# Patient Record
Sex: Male | Born: 1995 | Race: White | Hispanic: No | Marital: Single | Smoking: Never smoker
Health system: Southern US, Community
[De-identification: ages and names within clinical notes are randomized; demographics above are authoritative.]

## PROBLEM LIST (undated history)

## (undated) DIAGNOSIS — Z8781 Personal history of (healed) traumatic fracture: Secondary | ICD-10-CM

## (undated) HISTORY — DX: Personal history of (healed) traumatic fracture: Z87.81

---
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
## 2014-06-10 ENCOUNTER — Ambulatory Visit: Payer: BC Managed Care – PPO | Admitting: *Deleted

## 2014-06-10 DIAGNOSIS — Z23 Encounter for immunization: Secondary | ICD-10-CM

## 2014-06-16 ENCOUNTER — Ambulatory Visit (INDEPENDENT_AMBULATORY_CARE_PROVIDER_SITE_OTHER): Payer: BC Managed Care – PPO | Admitting: *Deleted

## 2014-06-16 DIAGNOSIS — Z23 Encounter for immunization: Secondary | ICD-10-CM

## 2014-11-12 IMAGING — CR DG HAND COMPLETE 3+V*L*
3 series · 3 of 3 positions shown · non-contrast
Comparison: None.

CLINICAL DATA: Follow up fracture

LEFT HAND - COMPLETE 3+ VIEW

[view not recorded (1 of 3)]
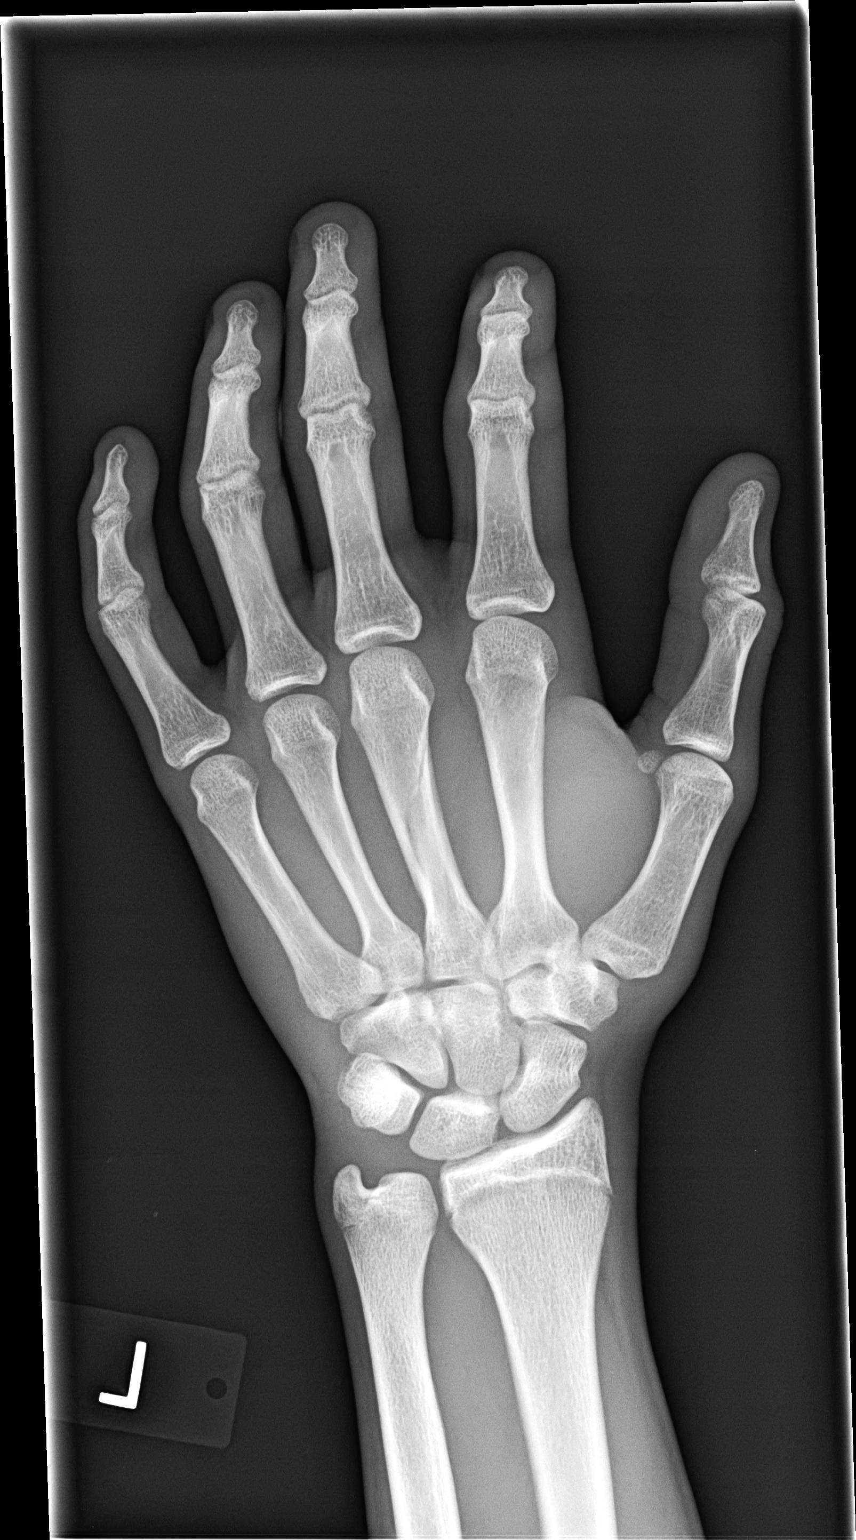

[view not recorded (2 of 3)]
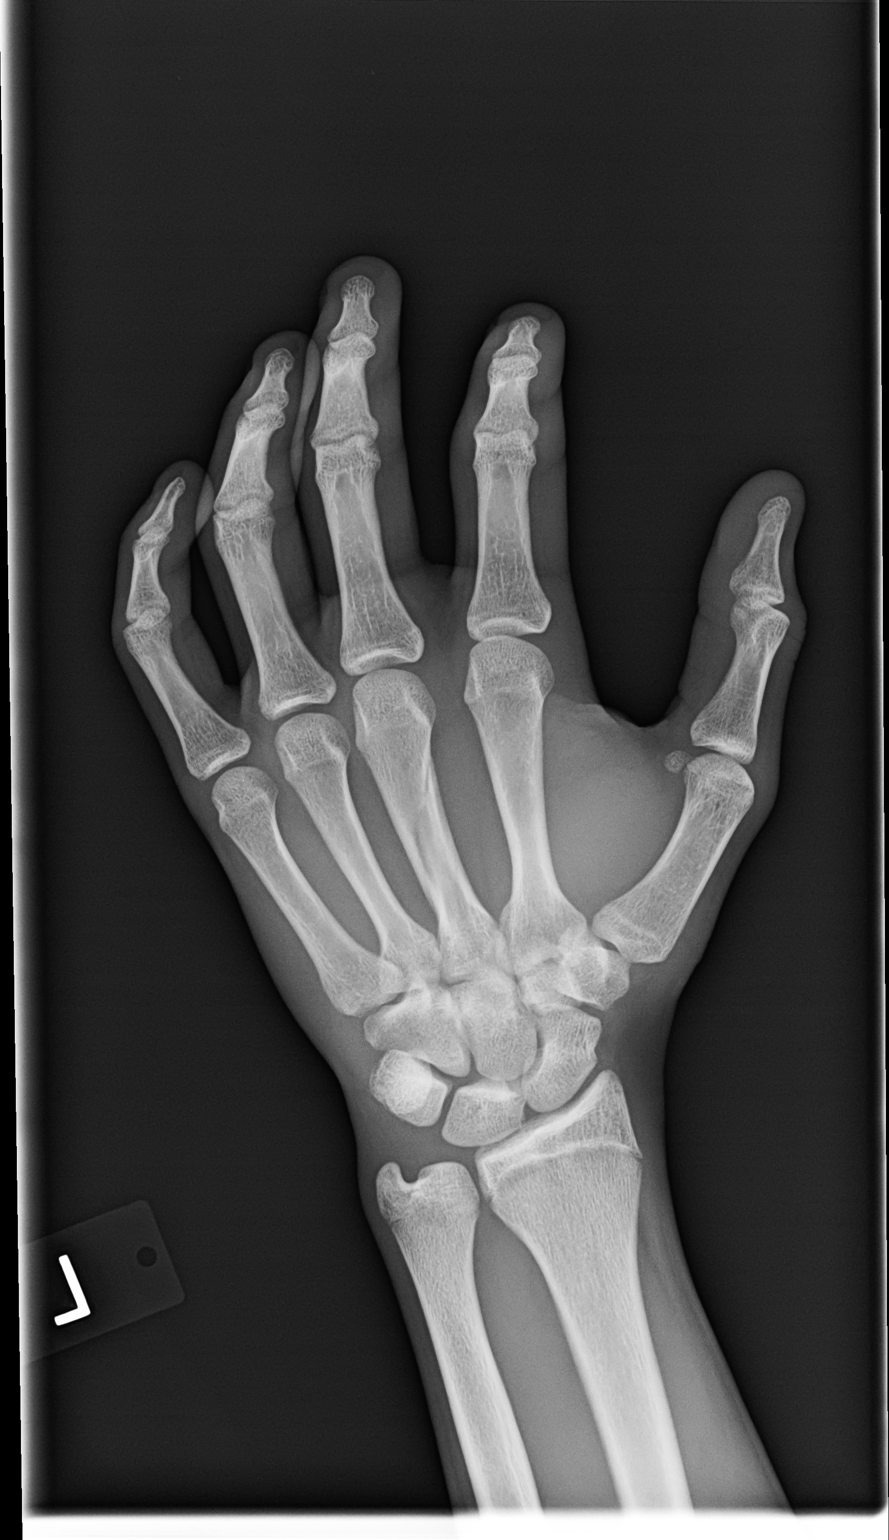

[view not recorded (3 of 3)]
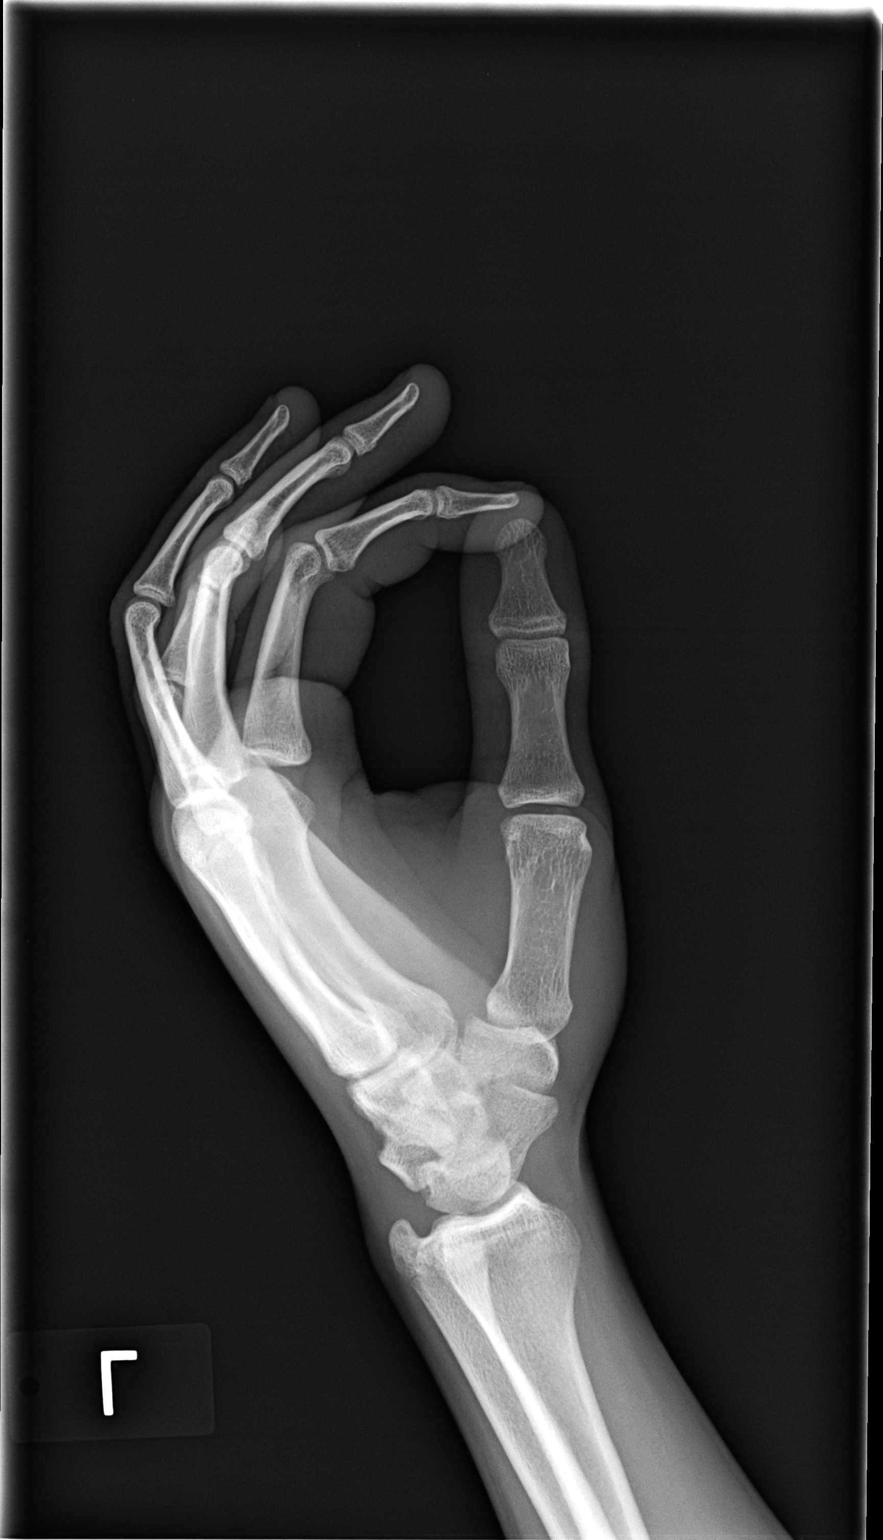

[3 of 3 positions shown; findings below may reference images not displayed]

FINDINGS: A healing fracture of the third metacarpal bone is
identified.  The fracture line is indistinct.  No new fractures or
subluxations.
IMPRESSION: 1.  Healing fracture of third metacarpal bone.

Clinically significant discrepancy from primary report, if
provided: None

## 2015-04-27 ENCOUNTER — Ambulatory Visit (INDEPENDENT_AMBULATORY_CARE_PROVIDER_SITE_OTHER): Payer: BLUE CROSS/BLUE SHIELD

## 2015-04-27 DIAGNOSIS — Z23 Encounter for immunization: Secondary | ICD-10-CM | POA: Diagnosis not present

## 2016-07-04 ENCOUNTER — Ambulatory Visit (INDEPENDENT_AMBULATORY_CARE_PROVIDER_SITE_OTHER): Payer: BLUE CROSS/BLUE SHIELD

## 2016-07-04 ENCOUNTER — Ambulatory Visit (INDEPENDENT_AMBULATORY_CARE_PROVIDER_SITE_OTHER): Payer: BLUE CROSS/BLUE SHIELD | Admitting: Family Medicine

## 2016-07-04 ENCOUNTER — Other Ambulatory Visit: Payer: Self-pay | Admitting: Family Medicine

## 2016-07-04 ENCOUNTER — Encounter: Payer: Self-pay | Admitting: Family Medicine

## 2016-07-04 VITALS — BP 94/56 | HR 67 | Temp 97.6°F | Ht 66.5 in | Wt 154.0 lb

## 2016-07-04 DIAGNOSIS — R0789 Other chest pain: Secondary | ICD-10-CM

## 2016-07-04 DIAGNOSIS — Z23 Encounter for immunization: Secondary | ICD-10-CM

## 2016-07-04 DIAGNOSIS — M940 Chondrocostal junction syndrome [Tietze]: Secondary | ICD-10-CM

## 2016-07-04 DIAGNOSIS — Z0000 Encounter for general adult medical examination without abnormal findings: Secondary | ICD-10-CM | POA: Diagnosis not present

## 2016-07-04 MED ORDER — MELOXICAM 15 MG PO TABS: 15.0000 mg | ORAL_TABLET | Freq: Every day | ORAL | 0 refills | Status: AC

## 2016-07-04 NOTE — Addendum Note (Signed)
Addended by: Magdalene RiverBULLINS, Chiyoko Torrico H on: 07/04/2016 12:16 PM   Modules accepted: Orders

## 2016-07-04 NOTE — Progress Notes (Signed)
Subjective:    Patient ID: Carlos Patel, male    DOB: May 15, 1996, 20 y.o.   MRN: [id number]  HPI Patient is here today for annual wellness exam and follow up of chronic medical problems. He is currently not taking any medications. The patient has been having some chest pain at times which seems to be worse with breathing and if he takes ibuprofen this seems to help this. He has 6 more months or another semester until he will be graduating from [employer] he has a job in [employer] [facility] [employer] [facility]. He'll be given an FOBT to return and he will get a chest x-ray today lab work today and an EKG.    There are no active problems to display for this patient.  Outpatient Encounter Prescriptions as of 07/04/2016  Medication Sig  . [DISCONTINUED] amoxicillin-clavulanate (AUGMENTIN ES-600) 600-42.9 MG/5ML suspension One and a half teaspoons po bid x 14 days  . [DISCONTINUED] antipyrine-benzocaine (AURALGAN) otic solution Place 3 drops into the right ear every 2 (two) hours as needed for pain.   No facility-administered encounter medications on file as of 07/04/2016.       Review of Systems  Constitutional: Negative.   HENT: Negative.   Eyes: Negative.   Respiratory: Negative.   Cardiovascular: Positive for chest pain (sharp pains at times - using IBU).  Gastrointestinal: Negative.   Endocrine: Negative.   Genitourinary: Negative.   Musculoskeletal: Negative.   Skin: Negative.   Allergic/Immunologic: Negative.   Neurological: Negative.   Hematological: Negative.   Psychiatric/Behavioral: Negative.        Objective:   Physical Exam  Constitutional: He is oriented to person, place, and time. He appears well-developed and well-nourished.  HENT:  Head: Normocephalic and atraumatic.  Right Ear: External ear normal.  Left Ear: External ear normal.  Nose: Nose normal.  Mouth/Throat: Oropharynx is clear and moist. No oropharyngeal exudate.  Eyes: Conjunctivae and EOM are normal. Pupils are  equal, round, and reactive to light. Right eye exhibits no discharge. Left eye exhibits no discharge. No scleral icterus.  Neck: Normal range of motion. Neck supple. No tracheal deviation present. No thyromegaly present.  Cardiovascular: Normal rate, regular rhythm, normal heart sounds and intact distal pulses.   No murmur heard. The heart has a regular rate and rhythm at 72/m  Pulmonary/Chest: Effort normal and breath sounds normal. No respiratory distress. He has no wheezes. He has no rales. He exhibits no tenderness.  Clear anteriorly and posteriorly no axillary adenopathy  Abdominal: Soft. Bowel sounds are normal. He exhibits no mass. There is no tenderness. There is no rebound and no guarding.  No abdominal tenderness or masses  Genitourinary: Penis normal.  Genitourinary Comments: There were no hernias palpated and the external genitalia appeared within normal limits without masses.  Musculoskeletal: Normal range of motion. He exhibits tenderness. He exhibits no edema.  Slight left sternal border tenderness at the lower margin.  Lymphadenopathy:    He has no cervical adenopathy.  Neurological: He is alert and oriented to person, place, and time. He has normal reflexes. No cranial nerve deficit.  Skin: Skin is warm and dry. No rash noted.  Psychiatric: He has a normal mood and affect. His behavior is normal. Judgment and thought content normal.  Nursing note and vitals reviewed.  BP (!) 94/56 (BP Location: Left Arm)   Pulse 67   Temp 97.6 F (36.4 C) (Oral)   Ht 5' 6.5" (1.689 m)   Wt 154 lb (69.9 kg)  BMI 24.48 kg/m         Assessment & Plan:  1. Annual physical exam - Lipid panel - BMP8+EGFR - CBC with Differential/Platelet - Hepatic function panel - VITAMIN D 25 Hydroxy (Vit-D Deficiency, Fractures) - DG Chest 2 View; Future - EKG 12-Lead  2. Other chest pain -Take meloxicam one daily for the next 10-14 days after eating, if this bothers her stomach discontinue  it - CBC with Differential/Platelet - DG Chest 2 View; Future - EKG 12-Lead  3. Costochondritis -Use warm wet compresses and take meloxicam as directed  Meds ordered this encounter  Medications  . meloxicam (MOBIC) 15 MG tablet    Sig: Take 1 tablet (15 mg total) by mouth daily.    Dispense:  15 tablet    Refill:  0   Patient Instructions  Continue current medications. Continue good therapeutic lifestyle changes which include good diet and exercise. Fall precautions discussed with patient. If an FOBT was given today- please return it to our front desk.  **Flu shots are available--- please call and schedule a FLU-CLINIC appointment**  After your visit with [place] today you will receive a survey in the mail or online from [employer] regarding your care with [place]. Please take a moment to fill this out. Your feedback is very important to [place] as you can help [place] better understand your patient needs as well as improve your experience and satisfaction. WE CARE ABOUT YOU!!!   Always drink plenty of fluids and stay active physically Take the meloxicam 15 mg 1 daily after breakfast for the next 10-15 days Avoid lifting heavy objects or pushing or pulling. Use some warm wet compresses on the chest wall Stop the meloxicam if it irritates the stomach with heartburn. Zenia Resides let us know if you do not get improvement.    Arrie Senate MD

## 2016-07-04 NOTE — Patient Instructions (Addendum)
Continue current medications. Continue good therapeutic lifestyle changes which include good diet and exercise. Fall precautions discussed with patient. If an FOBT was given today- please return it to our front desk.  **Flu shots are available--- please call and schedule a FLU-CLINIC appointment**  After your visit with us today you will receive a survey in the mail or online from [employer] regarding your care with us. Please take a moment to fill this out. Your feedback is very important to us as you can help us better understand your patient needs as well as improve your experience and satisfaction. WE CARE ABOUT YOU!!!   Always drink plenty of fluids and stay active physically Take the meloxicam 15 mg 1 daily after breakfast for the next 10-15 days Avoid lifting heavy objects or pushing or pulling. Use some warm wet compresses on the chest wall Stop the meloxicam if it irritates the stomach with heartburn. Ulice DashLees let us know if you do not get improvement.

## 2016-07-05 LAB — BMP8+EGFR
BUN/Creatinine Ratio: 14 (ref 9–20)
BUN: 15 mg/dL (ref 6–20)
CALCIUM: 9.7 mg/dL (ref 8.7–10.2)
CHLORIDE: 100 mmol/L (ref 96–106)
CO2: 23 mmol/L (ref 18–29)
Creatinine, Ser: 1.07 mg/dL (ref 0.76–1.27)
GFR calc non Af Amer: 99 mL/min/{1.73_m2} (ref >59)
GFR, EST AFRICAN AMERICAN: 114 mL/min/{1.73_m2} (ref >59)
GLUCOSE: 91 mg/dL (ref 65–99)
POTASSIUM: 4.7 mmol/L (ref 3.5–5.2)
Sodium: 140 mmol/L (ref 134–144)

## 2016-07-05 LAB — VITAMIN D 25 HYDROXY (VIT D DEFICIENCY, FRACTURES): VIT D 25 HYDROXY: 30.8 ng/mL (ref 30.0–100.0)

## 2016-07-05 LAB — CBC WITH DIFFERENTIAL/PLATELET
BASOS ABS: 0 10*3/uL (ref 0.0–0.2)
Basos: 0 %
EOS (ABSOLUTE): 0.1 10*3/uL (ref 0.0–0.4)
Eos: 1 %
Hematocrit: 42.3 % (ref 37.5–51.0)
Hemoglobin: 15 g/dL (ref 13.0–17.7)
Immature Grans (Abs): 0 10*3/uL (ref 0.0–0.1)
Immature Granulocytes: 0 %
LYMPHS ABS: 1.7 10*3/uL (ref 0.7–3.1)
Lymphs: 24 %
MCH: 30.5 pg (ref 26.6–33.0)
MCHC: 35.5 g/dL (ref 31.5–35.7)
MCV: 86 fL (ref 79–97)
Monocytes Absolute: 0.6 10*3/uL (ref 0.1–0.9)
Monocytes: 9 %
NEUTROS ABS: 4.7 10*3/uL (ref 1.4–7.0)
Neutrophils: 66 %
PLATELETS: 203 10*3/uL (ref 150–379)
RBC: 4.92 x10E6/uL (ref 4.14–5.80)
RDW: 13.8 % (ref 12.3–15.4)
WBC: 7.1 10*3/uL (ref 3.4–10.8)

## 2016-07-05 LAB — LIPID PANEL
CHOL/HDL RATIO: 3.8 ratio (ref 0.0–5.0)
Cholesterol, Total: 165 mg/dL (ref 100–199)
HDL: 44 mg/dL (ref >39)
LDL CALC: 97 mg/dL (ref 0–99)
TRIGLYCERIDES: 120 mg/dL (ref 0–149)
VLDL Cholesterol Cal: 24 mg/dL (ref 5–40)

## 2016-07-05 LAB — HEPATIC FUNCTION PANEL
ALBUMIN: 4.9 g/dL (ref 3.5–5.5)
ALT: 33 IU/L (ref 0–44)
AST: 30 IU/L (ref 0–40)
Alkaline Phosphatase: 70 IU/L (ref 39–117)
Bilirubin Total: 0.7 mg/dL (ref 0.0–1.2)
Bilirubin, Direct: 0.15 mg/dL (ref 0.00–0.40)
TOTAL PROTEIN: 7.3 g/dL (ref 6.0–8.5)

## 2016-07-05 NOTE — Progress Notes (Signed)
Patient aware.

## 2017-05-24 ENCOUNTER — Ambulatory Visit (INDEPENDENT_AMBULATORY_CARE_PROVIDER_SITE_OTHER): Payer: Managed Care, Other (non HMO)

## 2017-05-24 DIAGNOSIS — Z23 Encounter for immunization: Secondary | ICD-10-CM | POA: Diagnosis not present

## 2018-05-21 IMAGING — DX DG CHEST 2V
2 series · 2 of 2 positions shown · non-contrast
Comparison: None.

CLINICAL DATA: Chest pain

EXAM:
CHEST  2 VIEW

[chest pa]
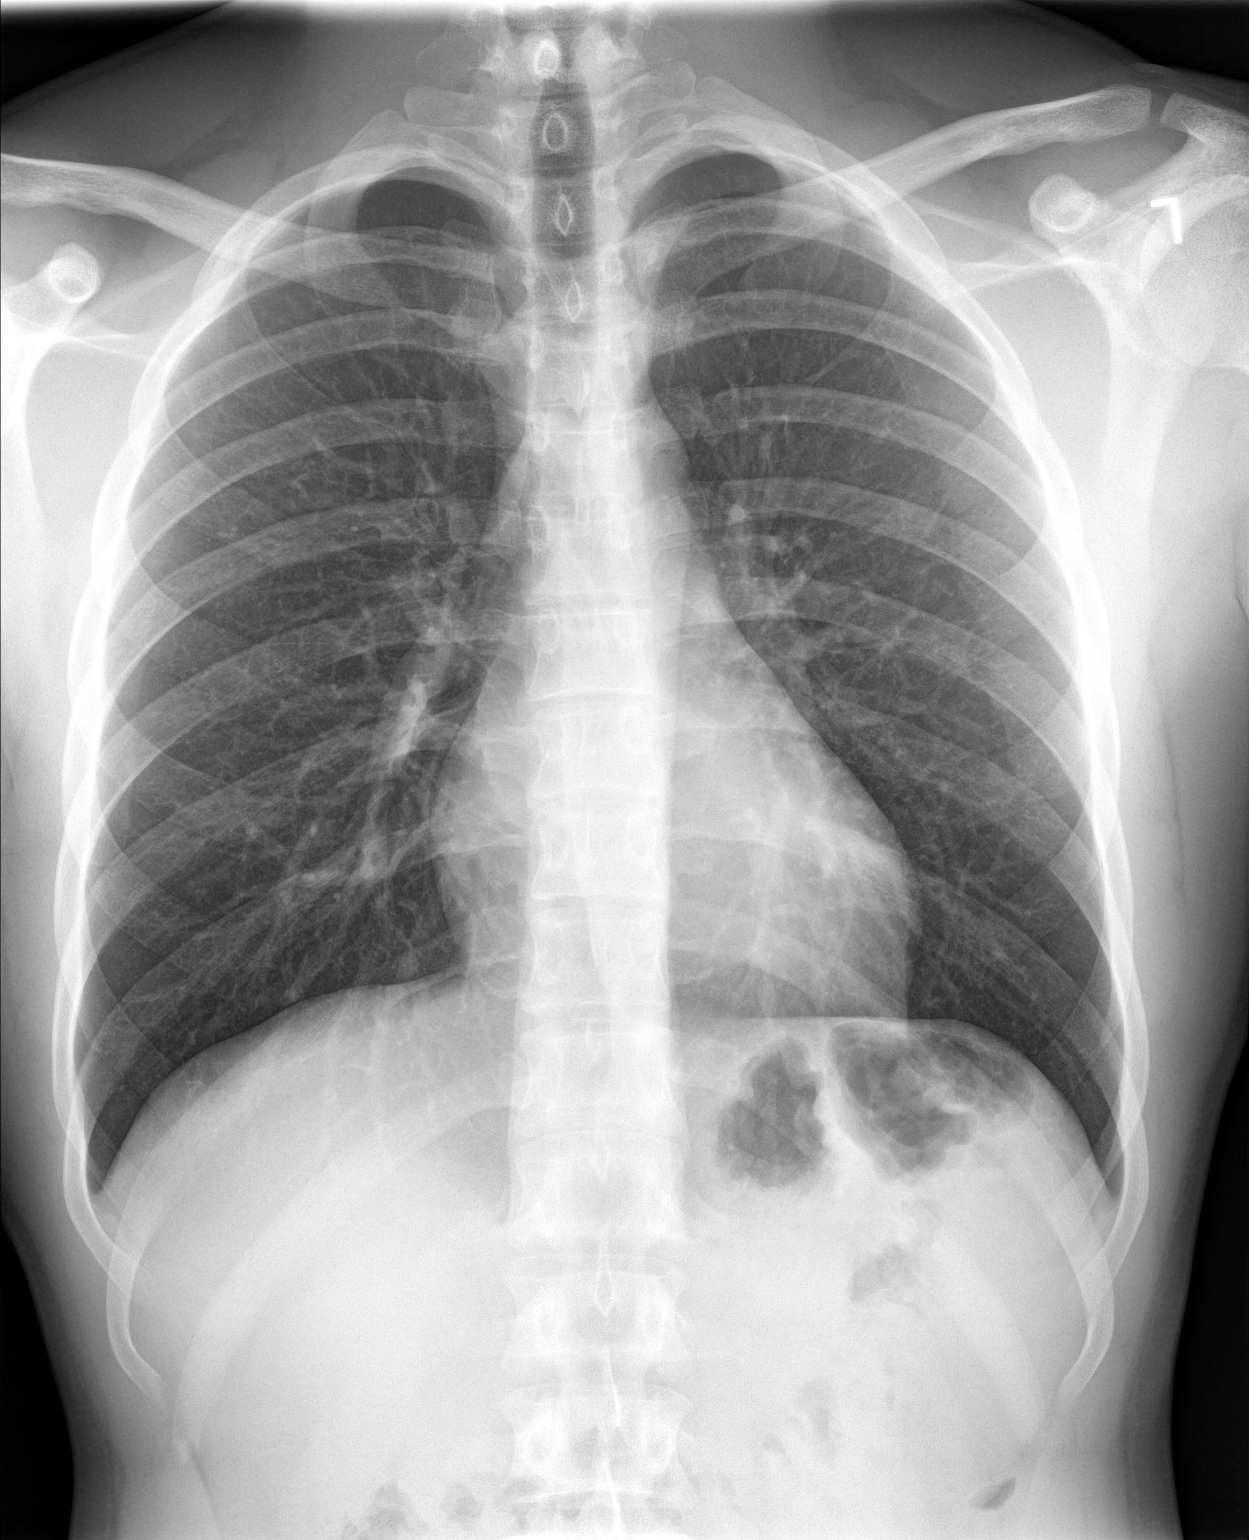

[chest lat]
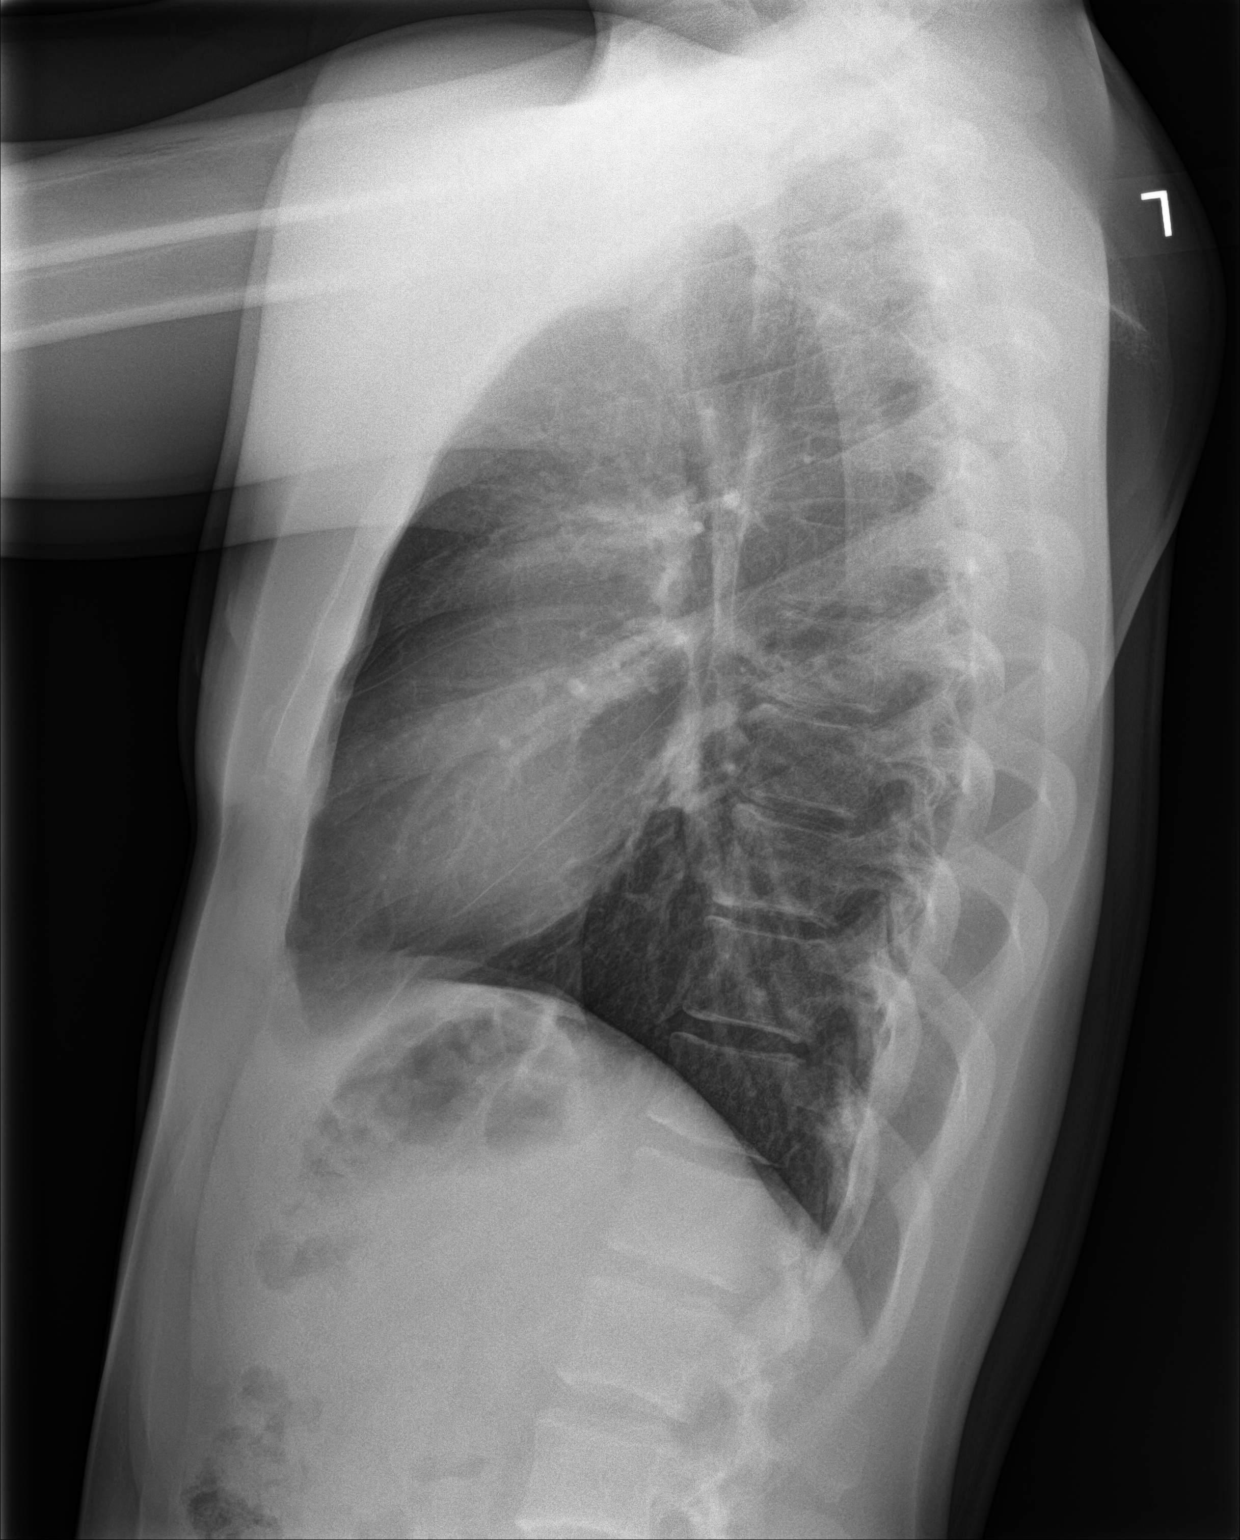

[2 of 2 positions shown; findings below may reference images not displayed]

FINDINGS: The heart size and mediastinal contours are within normal limits.
Both lungs are clear. The visualized skeletal structures are
unremarkable.
IMPRESSION: No active cardiopulmonary disease.

## 2019-04-30 ENCOUNTER — Other Ambulatory Visit: Payer: Self-pay

## 2019-04-30 DIAGNOSIS — Z20822 Contact with and (suspected) exposure to covid-19: Secondary | ICD-10-CM

## 2019-05-02 LAB — NOVEL CORONAVIRUS, NAA: SARS-CoV-2, NAA: NOT DETECTED
